# Patient Record
Sex: Female | Born: 1960 | Hispanic: No | Marital: Married | Smoking: Never smoker
Health system: Southern US, Community
[De-identification: ages and names within clinical notes are randomized; demographics above are authoritative.]

## PROBLEM LIST (undated history)

## (undated) DIAGNOSIS — I10 Essential (primary) hypertension: Secondary | ICD-10-CM

---

## 2017-12-27 ENCOUNTER — Other Ambulatory Visit: Payer: Self-pay

## 2017-12-27 ENCOUNTER — Emergency Department (HOSPITAL_BASED_OUTPATIENT_CLINIC_OR_DEPARTMENT_OTHER)
Admission: EM | Admit: 2017-12-27 | Discharge: 2017-12-27 | Disposition: A | Discharge status: Home / Self Care | Payer: Self-pay | Attending: Emergency Medicine | Admitting: Emergency Medicine

## 2017-12-27 ENCOUNTER — Emergency Department (HOSPITAL_BASED_OUTPATIENT_CLINIC_OR_DEPARTMENT_OTHER): Payer: Self-pay

## 2017-12-27 ENCOUNTER — Encounter (HOSPITAL_BASED_OUTPATIENT_CLINIC_OR_DEPARTMENT_OTHER): Payer: Self-pay | Admitting: *Deleted

## 2017-12-27 DIAGNOSIS — E860 Dehydration: Secondary | ICD-10-CM | POA: Insufficient documentation

## 2017-12-27 DIAGNOSIS — R509 Fever, unspecified: Secondary | ICD-10-CM | POA: Insufficient documentation

## 2017-12-27 LAB — URINALYSIS, MICROSCOPIC (REFLEX)

## 2017-12-27 LAB — CBC WITH DIFFERENTIAL/PLATELET
BASOS ABS: 0 10*3/uL (ref 0.0–0.1)
BASOS PCT: 0 %
Eosinophils Absolute: 0 10*3/uL (ref 0.0–0.7)
Eosinophils Relative: 0 %
HEMATOCRIT: 38.8 % (ref 36.0–46.0)
Hemoglobin: 13.1 g/dL (ref 12.0–15.0)
LYMPHS ABS: 0.4 10*3/uL — AB (ref 0.7–4.0)
LYMPHS PCT: 5 %
MCH: 30.5 pg (ref 26.0–34.0)
MCHC: 33.8 g/dL (ref 30.0–36.0)
MCV: 90.4 fL (ref 78.0–100.0)
MONO ABS: 0.4 10*3/uL (ref 0.1–1.0)
Monocytes Relative: 6 %
Neutro Abs: 6.8 10*3/uL (ref 1.7–7.7)
Neutrophils Relative %: 89 %
PLATELETS: 103 10*3/uL — AB (ref 150–400)
RBC: 4.29 MIL/uL (ref 3.87–5.11)
RDW: 14.3 % (ref 11.5–15.5)
WBC: 7.5 10*3/uL (ref 4.0–10.5)

## 2017-12-27 LAB — URINALYSIS, ROUTINE W REFLEX MICROSCOPIC
Bilirubin Urine: NEGATIVE
GLUCOSE, UA: NEGATIVE mg/dL
HGB URINE DIPSTICK: NEGATIVE
Ketones, ur: NEGATIVE mg/dL
Leukocytes, UA: NEGATIVE
Nitrite: NEGATIVE
PH: 6 (ref 5.0–8.0)
Protein, ur: 30 mg/dL — AB
Specific Gravity, Urine: 1.02 (ref 1.005–1.030)

## 2017-12-27 LAB — COMPREHENSIVE METABOLIC PANEL
ALBUMIN: 3.8 g/dL (ref 3.5–5.0)
ALT: 59 U/L — AB (ref 0–44)
AST: 64 U/L — AB (ref 15–41)
Alkaline Phosphatase: 82 U/L (ref 38–126)
Anion gap: 10 (ref 5–15)
BILIRUBIN TOTAL: 1.9 mg/dL — AB (ref 0.3–1.2)
BUN: 18 mg/dL (ref 6–20)
CHLORIDE: 94 mmol/L — AB (ref 98–111)
CO2: 24 mmol/L (ref 22–32)
CREATININE: 1.04 mg/dL — AB (ref 0.44–1.00)
Calcium: 8.7 mg/dL — ABNORMAL LOW (ref 8.9–10.3)
GFR calc Af Amer: >60 mL/min (ref >60)
GFR calc non Af Amer: 59 mL/min — ABNORMAL LOW (ref >60)
GLUCOSE: 171 mg/dL — AB (ref 70–99)
POTASSIUM: 3.7 mmol/L (ref 3.5–5.1)
Sodium: 128 mmol/L — ABNORMAL LOW (ref 135–145)
Total Protein: 8 g/dL (ref 6.5–8.1)

## 2017-12-27 LAB — LIPASE, BLOOD: Lipase: 25 U/L (ref 11–51)

## 2017-12-27 LAB — I-STAT CG4 LACTIC ACID, ED: Lactic Acid, Venous: 2.28 mmol/L (ref 0.5–1.9)

## 2017-12-27 MED ORDER — ACETAMINOPHEN 325 MG PO TABS
650.0000 mg | ORAL_TABLET | Freq: Once | ORAL | Status: AC
  Administered 2017-12-27: 650 mg via ORAL
  Filled 2017-12-27: qty 2

## 2017-12-27 MED ORDER — ONDANSETRON HCL 4 MG/2ML IJ SOLN
4.0000 mg | Freq: Once | INTRAMUSCULAR | Status: AC
  Administered 2017-12-27: 4 mg via INTRAVENOUS
  Filled 2017-12-27: qty 2

## 2017-12-27 MED ORDER — IBUPROFEN 400 MG PO TABS
400.0000 mg | ORAL_TABLET | Freq: Once | ORAL | Status: AC
  Administered 2017-12-27: 400 mg via ORAL
  Filled 2017-12-27: qty 1

## 2017-12-27 MED ORDER — SODIUM CHLORIDE 0.9 % IV SOLN: INTRAVENOUS | Status: DC

## 2017-12-27 MED ORDER — SODIUM CHLORIDE 0.9 % IV BOLUS
1000.0000 mL | Freq: Once | INTRAVENOUS | Status: AC
  Administered 2017-12-27: 1000 mL via INTRAVENOUS

## 2017-12-27 NOTE — ED Notes (Signed)
Patient is travelling from [place] and is staying with her nephew.  Nephew refused CT scan stating that he is worried because she does not have insurance.  He does not want CT scan done.

## 2017-12-27 NOTE — ED Provider Notes (Signed)
[facility] EMERGENCY DEPARTMENT Provider Note   CSN: [id number] Arrival date & time: 12/27/17  1817     History   Chief Complaint Chief Complaint  Patient presents with  . Fever  . Abdominal Pain    HPI Brandi Burnett is a 57 y.o. female.  Patient is a 57 year old female who is originally from [place] and has been visiting the [place] for the last 1 month.  She has a history of breast cancer that she did not receive chemotherapy for and a history of uterine fibroids as well as cysts on her liver and kidneys.  She states she had been feeling well since she has been in the states until Saturday 2 days prior to arrival when she started feeling feverish, nausea, decreased appetite and mild diffuse abdominal discomfort.  She denies any urinary symptoms, diarrhea, cough, shortness of breath.  She does not feel that her abdomen is distended or looks any different than normal.  Everyone she has been with has seemed healthy and they have all eaten the same kind of food and nobody else is feeling sick.  She takes medication at this time for hypertension but no other meds.  The history is provided by the patient and a relative. The history is limited by a language barrier. A language interpreter was used.  Fever   This is a new problem. The current episode started 2 days ago. The problem occurs constantly. The problem has not changed since onset.The maximum temperature noted was 101 to 101.9 F. The temperature was taken using an oral thermometer. Associated symptoms include muscle aches. Pertinent negatives include no chest pain, no diarrhea, no vomiting, no sore throat, no tugging at ear and no cough. Associated symptoms comments: Nausea and diffuse abd discomfort.  Anorexia and general weakness. Treatments tried: Paracetamol. The treatment provided mild relief.  Abdominal Pain   Associated symptoms include fever. Pertinent negatives include diarrhea and vomiting.    History reviewed.  No pertinent past medical history.  There are no active problems to display for this patient.   History reviewed. No pertinent surgical history.   OB History   None      Home Medications    Prior to Admission medications   Not on File    Family History No family history on file.  Social History Social History   Tobacco Use  . Smoking status: Never Smoker  . Smokeless tobacco: Never Used  Substance Use Topics  . Alcohol use: Never    Frequency: Never  . Drug use: Never     Allergies   Patient has no known allergies.   Review of Systems Review of Systems  Constitutional: Positive for fever.  HENT: Negative for sore throat.   Respiratory: Negative for cough.   Cardiovascular: Negative for chest pain.  Gastrointestinal: Positive for abdominal pain. Negative for diarrhea and vomiting.  All other systems reviewed and are negative.    Physical Exam Updated Vital Signs BP (!) 116/98   Pulse (!) 110   Temp 100.3 F (37.9 C) (Oral)   Resp 20   Ht 5\' 5"  (1.651 m)   Wt 59 kg   SpO2 99%   BMI 21.63 kg/m   Physical Exam  Constitutional: She is oriented to person, place, and time. She appears well-developed and well-nourished. No distress.  HENT:  Head: Normocephalic and atraumatic.  Mouth/Throat: Oropharynx is clear and moist.  Dry mucous membranes  Eyes: Pupils are equal, round, and reactive to light. Conjunctivae  and EOM are normal.  Neck: Normal range of motion. Neck supple.  Cardiovascular: Regular rhythm and intact distal pulses. Tachycardia present.  No murmur heard. Pulmonary/Chest: Effort normal and breath sounds normal. No respiratory distress. She has no wheezes. She has no rales.  Abdominal: Soft. Bowel sounds are normal. She exhibits distension. There is hepatomegaly. There is tenderness in the periumbilical area. There is no rebound and no guarding.  Musculoskeletal: Normal range of motion. She exhibits no edema or tenderness.  Neurological:  She is alert and oriented to person, place, and time.  Skin: Skin is warm and dry. No rash noted. No erythema.  Psychiatric: She has a normal mood and affect. Her behavior is normal.  Nursing note and vitals reviewed.    ED Treatments / Results  Labs (all labs ordered are listed, but only abnormal results are displayed) Labs Reviewed  CBC WITH DIFFERENTIAL/PLATELET - Abnormal; Notable for the following components:      Result Value   Lymphs Abs 0.4 (*)    All other components within normal limits  COMPREHENSIVE METABOLIC PANEL - Abnormal; Notable for the following components:   Sodium 128 (*)    Chloride 94 (*)    Glucose, Bld 171 (*)    Creatinine, Ser 1.04 (*)    Calcium 8.7 (*)    AST 64 (*)    ALT 59 (*)    Total Bilirubin 1.9 (*)    GFR calc non Af Amer 59 (*)    All other components within normal limits  URINALYSIS, ROUTINE W REFLEX MICROSCOPIC - Abnormal; Notable for the following components:   APPearance CLOUDY (*)    Protein, ur 30 (*)    All other components within normal limits  URINALYSIS, MICROSCOPIC (REFLEX) - Abnormal; Notable for the following components:   Bacteria, UA MANY (*)    All other components within normal limits  I-STAT CG4 LACTIC ACID, ED - Abnormal; Notable for the following components:   Lactic Acid, Venous 2.28 (*)    All other components within normal limits  LIPASE, BLOOD    EKG None  Radiology No results found.  Procedures Procedures (including critical care time)  Medications Ordered in ED Medications  sodium chloride 0.9 % bolus 1,000 mL (1,000 mLs Intravenous New Bag/Given 12/27/17 1959)  0.9 %  sodium chloride infusion (has no administration in time range)  ondansetron (ZOFRAN) injection 4 mg (4 mg Intravenous Given 12/27/17 1940)     Initial Impression / Assessment and Plan / ED Course  I have reviewed the triage vital signs and the nursing notes.  Pertinent labs & imaging results that were available during my care of the  patient were reviewed by me and considered in my medical decision making (see chart for details).     Patient presenting today with 2 days of fever, abdominal pain and nausea.  Unclear completely about patient's past medical history as she is from [place] and had all of her medical care there.  She does have a prior history of breast cancer and states she has been told she has cysts on her kidneys and liver.  On exam patient's abdomen is distended with hepatomegaly.  She was feeling well upon arrival here and low suspicion for foodborne illness or sick contact that she has had none.  Concern for some type of hepatitis or other liver etiology.  This could be viral.  Patient does appear dehydrated and is tachycardic.  Lactic acid is 2.28, CBC within normal limits but platelets  are still pending, CMP with mild hyponatremia of 128, creatinine of 1.04, elevated LFTs with AST of 64 ALT of 59 and total bilirubin of 1.9, lipase within normal limits.  UA without specific findings.  Patient given IV fluids and Zofran.  Recommended a CT for further evaluation but family is currently considering it as she does not have insurance here and they are concerned about the bill.   8:53 PM Patient is refusing CT and states now her abdomen does not hurt at all.  She is feeling better after fluids and able to tolerate p.o.'s.  At this point patient has a fever of unknown origin.  Could be viral in nature but also discussed with the family it could be something abdominal.  Also stressed with the family her abnormal liver function tests.  Patient would like to follow-up with her doctor when she returns to [place] next week.  Gave family strict return precautions.  Repeat temp here was 103 and pt given ibuprofen.  Tolerating po's.  After temp is still 103.  She was given dose of tylenol and pt d/ced homel Final Clinical Impressions(s) / ED Diagnoses   Final diagnoses:  Fever, unspecified fever cause  Dehydration    ED  Discharge Orders    None       Gwyneth Sprout, MD 12/27/17 2227

## 2017-12-27 NOTE — ED Triage Notes (Signed)
Pt is visiting from [place]. She has been here for almost a month. Here tonight with fever. Chills. Abdominal pain and nausea.

## 2017-12-27 NOTE — Progress Notes (Signed)
DR Anitra Lauth notified of lactic acid result

## 2017-12-27 NOTE — Discharge Instructions (Addendum)
Use Ibuprofen every 6 hours for the fever.  You can take lukewarm baths and do not wrap up in blankets

## 2017-12-29 ENCOUNTER — Emergency Department (HOSPITAL_BASED_OUTPATIENT_CLINIC_OR_DEPARTMENT_OTHER): Payer: Self-pay

## 2017-12-29 ENCOUNTER — Encounter (HOSPITAL_BASED_OUTPATIENT_CLINIC_OR_DEPARTMENT_OTHER): Payer: Self-pay | Admitting: *Deleted

## 2017-12-29 ENCOUNTER — Emergency Department (HOSPITAL_BASED_OUTPATIENT_CLINIC_OR_DEPARTMENT_OTHER)
Admission: EM | Admit: 2017-12-29 | Discharge: 2017-12-30 | Disposition: A | Discharge status: Home / Self Care | Payer: Self-pay | Attending: Emergency Medicine | Admitting: Emergency Medicine

## 2017-12-29 ENCOUNTER — Other Ambulatory Visit: Payer: Self-pay

## 2017-12-29 DIAGNOSIS — Q446 Cystic disease of liver: Secondary | ICD-10-CM | POA: Insufficient documentation

## 2017-12-29 DIAGNOSIS — Q613 Polycystic kidney, unspecified: Secondary | ICD-10-CM | POA: Insufficient documentation

## 2017-12-29 DIAGNOSIS — I10 Essential (primary) hypertension: Secondary | ICD-10-CM | POA: Insufficient documentation

## 2017-12-29 DIAGNOSIS — Z79899 Other long term (current) drug therapy: Secondary | ICD-10-CM | POA: Insufficient documentation

## 2017-12-29 HISTORY — DX: Essential (primary) hypertension: I10

## 2017-12-29 NOTE — ED Notes (Signed)
Husband reports ongoing fevers since Friday, increased fatigue and bloating to abdomen.

## 2017-12-29 NOTE — ED Provider Notes (Signed)
[facility] Provider Note: Lowella Dell, MD, FACEP  CSN: [id number] MRN: [id number] ARRIVAL: 12/29/17 at 2302 ROOM: [facility]03/[facility]   CHIEF COMPLAINT  Shortness of Breath  [place]rin interpreter used. HISTORY OF PRESENT ILLNESS  12/29/17 11:57 PM Brandi Burnett is a 57 y.o. female with a history of breast cancer not treated with chemotherapy.  She also has a history of uterine fibroids and cysts on her liver and kidneys.  She was seen here 2 days ago for fever, nausea, decreased appetite and mild diffuse abdominal discomfort.  At that time she denied urinary symptoms, diarrhea, cough and shortness of breath.  She was noted to have a distended abdomen with hepatomegaly at that time.  She was treated with IV fluids and Zofran.  She declined a recommended CT scan.  She returns with worsening abdominal distention now causing her to be short of breath.  She denies abdominal pain but states she has an abdominal discomfort.  In fact however she has pain when her liver is pressed on.  She denies nausea or vomiting but has had watery, green diarrhea for the past 2 days.  She has also had decreased appetite and a limited ability to swallow food or liquids.  She states her fevers have been controlled.  Her relative explains that she has a history of polycystic liver and kidney disease, not just simple cysts.   Past Medical History:  Diagnosis Date  . Hypertension     History reviewed. No pertinent surgical history.  History reviewed. No pertinent family history.  Social History   Tobacco Use  . Smoking status: Never Smoker  . Smokeless tobacco: Never Used  Substance Use Topics  . Alcohol use: Never    Frequency: Never  . Drug use: Never    Prior to Admission medications   Medication Sig Start Date End Date Taking? Authorizing Provider  acetaminophen (TYLENOL) 500 MG tablet Take 500 mg by mouth every 6 (six) hours as needed.   Yes [provider]  ibuprofen (ADVIL,MOTRIN) 200  MG tablet Take 200 mg by mouth every 6 (six) hours as needed.   Yes [provider]    Allergies Patient has no known allergies.   REVIEW OF SYSTEMS  Negative except as noted here or in the History of Present Illness.   PHYSICAL EXAMINATION  Initial Vital Signs Pulse (!) 105, temperature 98.5 F (36.9 C), temperature source Oral, resp. rate 16, height 5\' 5"  (1.651 m), weight 58 kg, SpO2 100 %.  Examination General: Well-developed, well-nourished female in no acute distress; appearance consistent with age of record HENT: normocephalic; atraumatic Eyes: pupils equal, round and reactive to light; extraocular muscles intact Neck: supple Heart: regular rate and rhythm Lungs: clear to auscultation bilaterally; decreased sounds in bases Abdomen: Soft; distended; tender, enlarged liver; bowel sounds present but hypoactive Extremities: No deformity; full range of motion; pulses normal Neurologic: Awake, alert; motor function intact in all extremities and symmetric; no facial droop Skin: Warm and dry; pale Psychiatric: Normal mood and affect   RESULTS  Summary of this visit's results, reviewed by myself:   EKG Interpretation  Date/Time:    Ventricular Rate:    PR Interval:    QRS Duration:   QT Interval:    QTC Calculation:   R Axis:     Text Interpretation:        Laboratory Studies: Results for orders placed or performed during the hospital encounter of 12/29/17 (from the past 24 hour(s))  Comprehensive metabolic panel  Status: Abnormal   Collection Time: 12/30/17 12:28 AM  Result Value Ref Range   Sodium 130 (L) 135 - 145 mmol/L   Potassium 3.4 (L) 3.5 - 5.1 mmol/L   Chloride 99 98 - 111 mmol/L   CO2 20 (L) 22 - 32 mmol/L   Glucose, Bld 113 (H) 70 - 99 mg/dL   BUN 32 (H) 6 - 20 mg/dL   Creatinine, Ser 0.98 (H) 0.44 - 1.00 mg/dL   Calcium 7.4 (L) 8.9 - 10.3 mg/dL   Total Protein 6.0 (L) 6.5 - 8.1 g/dL   Albumin 2.7 (L) 3.5 - 5.0 g/dL   AST 28 15 - 41  U/L   ALT 36 0 - 44 U/L   Alkaline Phosphatase 104 38 - 126 U/L   Total Bilirubin 1.4 (H) 0.3 - 1.2 mg/dL   GFR calc non Af Amer 48 (L) >60 mL/min   GFR calc Af Amer 55 (L) >60 mL/min   Anion gap 11 5 - 15  Lipase, blood     Status: None   Collection Time: 12/30/17 12:28 AM  Result Value Ref Range   Lipase 20 11 - 51 U/L  CBC with Differential/Platelet     Status: Abnormal   Collection Time: 12/30/17 12:28 AM  Result Value Ref Range   WBC 6.2 4.0 - 10.5 K/uL   RBC 3.84 (L) 3.87 - 5.11 MIL/uL   Hemoglobin 11.6 (L) 12.0 - 15.0 g/dL   HCT 11.9 (L) 14.7 - 82.9 %   MCV 92.7 80.0 - 100.0 fL   MCH 30.2 26.0 - 34.0 pg   MCHC 32.6 30.0 - 36.0 g/dL   RDW 56.2 13.0 - 86.5 %   Platelets 111 (L) 150 - 400 K/uL   nRBC 0.0 0.0 - 0.2 %   Neutrophils Relative % 82 %   Neutro Abs 5.1 1.7 - 7.7 K/uL   Lymphocytes Relative 6 %   Lymphs Abs 0.4 (L) 0.7 - 4.0 K/uL   Monocytes Relative 8 %   Monocytes Absolute 0.5 0.1 - 1.0 K/uL   Eosinophils Relative 3 %   Eosinophils Absolute 0.2 0.0 - 0.5 K/uL   Basophils Relative 0 %   Basophils Absolute 0.0 0.0 - 0.1 K/uL   WBC Morphology VACUOLATED NEUTROPHILS    Smear Review PLATELETS APPEAR DECREASED    Immature Granulocytes 1 %   Abs Immature Granulocytes 0.05 0.00 - 0.07 K/uL   Platelet Morphology PAD    Imaging Studies: Dg Chest 2 View  Result Date: 12/30/2017 CLINICAL DATA:  Dyspnea EXAM: CHEST - 2 VIEW COMPARISON:  None. FINDINGS: Low lung volumes. No pulmonary consolidation or effusion. No overt pulmonary edema. Slight reversal of thoracic curvature possibly positional. Heart size is normal. Nonaneurysmal thoracic aorta. Five metallic circular densities project over the left lateral chest. IMPRESSION: No active cardiopulmonary disease. Electronically Signed   By: Tollie Eth M.D.   On: 12/30/2017 00:05   Ct Abdomen Pelvis W Contrast  Result Date: 12/30/2017 CLINICAL DATA:  Abdominal pain and bloating. Polycystic liver and kidney disease. EXAM:  CT ABDOMEN AND PELVIS WITH CONTRAST TECHNIQUE: Multidetector CT imaging of the abdomen and pelvis was performed using the standard protocol following bolus administration of intravenous contrast. CONTRAST:  ISOVUE-300 IOPAMIDOL (ISOVUE-300) INJECTION 61% COMPARISON:  None. FINDINGS: Lower chest: Small bilateral pleural effusions. Mild atelectasis along both hemidiaphragms. Bilateral breast implants. Hepatobiliary: Notable hepatomegaly with innumerable cysts throughout the hepatic parenchyma. The liver measures about 29.6 cm craniocaudad. There is an appearance of  some peripheral vascular shunting or portosystemic shunting in the periphery of the right hepatic lobe for example on images 62-64 series 2 or there is a notable subcapsular vascular channel. There is also some heterogeneity in density in the hepatic parenchyma on the early phase images which becomes more uniform on delayed images. Lobularity of the liver may reflect underlying cirrhosis or could be a consequence of the extensive cystic disease. There are some punctate calcifications along confluent septations between cysts posteriorly in the right hepatic lobe for example on image 72/5. Portal vein is patent. The multiplicity of cysts makes it difficult to recognize the gallbladder or biliary tree, but no definite intrahepatic biliary dilatation is identified. Pancreas: Unremarkable Spleen: There is mass effect on the margins of the spleen due to the hepatic cysts. Adrenals/Urinary Tract: Polycystic kidney disease is present bilaterally with the left kidney measuring 17.3 cm in length and the right kidney measuring 14.1 cm in length. There is likely scarring in the right kidney. Extensive cystic disease in both kidneys of varying complexity but mostly simple noted. There some scant calcifications along septations, for example in the right kidney on image 71/2. The parapelvic cysts cause distortion of the renal collecting systems. No definite urinary  tract calculi. No hydronephrosis. Stomach/Bowel: There is mass effect on the stomach from some of the hepatic cystic lesions. No appreciable dilated bowel. The appendix is poorly seen. Vascular/Lymphatic: Prominent venous structures to the left of the abdominal aorta at the level of the renal veins. A suspected lymph node anterior to the abdominal aorta measures 1.2 cm in short axis on image 55/2. This could instead be a venous varix. Reproductive: Mildly prominent uterus for age, measuring 10.3 by 6.5 by 7.8 cm. Other: Scattered ascites, cause uncertain. This is more confluent in the pelvis. Musculoskeletal: Slight kyphosis at the L2-3 level with mild disc bulge at this level. Small umbilical hernia contains adipose tissue. IMPRESSION: 1. Polycystic liver and polycystic kidney disease with markedly expanded liver and kidneys due to innumerable cysts. Nodular contour the liver could reflect cirrhosis. There is likely some portosystemic shunting along the liver capsule in the right hepatic lobe. 2. Scattered mild to moderate ascites. 3. Small bilateral pleural effusions. 4. Questionable mildly enlarged retroperitoneal lymph node, versus venous varix. 5. Mildly prominent uterus for age. Electronically Signed   By: Gaylyn Rong M.D.   On: 12/30/2017 02:36    ED COURSE and MDM  Nursing notes and initial vitals signs, including pulse oximetry, reviewed.  Vitals:   12/29/17 2312 12/29/17 2313 12/30/17 0220  BP:   (!) 90/52  Pulse: (!) 105  82  Resp: 16  16  Temp: 98.5 F (36.9 C)  97.7 F (36.5 C)  TempSrc: Oral  Oral  SpO2: 100%  98%  Weight:  58 kg   Height:  5\' 5"  (1.651 m)    3:06 AM The patient was advised that admission was recommended so that interventional radiology could be consulted for possible aspiration of fluid for symptomatic relief.  The patient chose to decline admission and prefers to follow-up with her primary care physician in [place] which she plans to travel in a few days.   She was advised she should return should she changed her mind or should symptoms worsen.  PROCEDURES    ED DIAGNOSES     ICD-10-CM   1. Polycystic liver with compressive symptoms Q44.6   2. Polycystic kidney Q61.3        Frederica Chrestman, Jonny Ruiz, MD 12/30/17 [phone]

## 2017-12-29 NOTE — ED Triage Notes (Signed)
Pt c/o SOb x 1 hr , seen here 3 days ago for  fever and dehydration

## 2017-12-30 ENCOUNTER — Emergency Department (HOSPITAL_BASED_OUTPATIENT_CLINIC_OR_DEPARTMENT_OTHER): Payer: Self-pay

## 2017-12-30 LAB — CBC WITH DIFFERENTIAL/PLATELET
ABS IMMATURE GRANULOCYTES: 0.05 10*3/uL (ref 0.00–0.07)
Basophils Absolute: 0 10*3/uL (ref 0.0–0.1)
Basophils Relative: 0 %
EOS PCT: 3 %
Eosinophils Absolute: 0.2 10*3/uL (ref 0.0–0.5)
HEMATOCRIT: 35.6 % — AB (ref 36.0–46.0)
Hemoglobin: 11.6 g/dL — ABNORMAL LOW (ref 12.0–15.0)
IMMATURE GRANULOCYTES: 1 %
LYMPHS ABS: 0.4 10*3/uL — AB (ref 0.7–4.0)
LYMPHS PCT: 6 %
MCH: 30.2 pg (ref 26.0–34.0)
MCHC: 32.6 g/dL (ref 30.0–36.0)
MCV: 92.7 fL (ref 80.0–100.0)
MONOS PCT: 8 %
Monocytes Absolute: 0.5 10*3/uL (ref 0.1–1.0)
NRBC: 0 % (ref 0.0–0.2)
Neutro Abs: 5.1 10*3/uL (ref 1.7–7.7)
Neutrophils Relative %: 82 %
PLATELETS: 111 10*3/uL — AB (ref 150–400)
RBC: 3.84 MIL/uL — ABNORMAL LOW (ref 3.87–5.11)
RDW: 14.5 % (ref 11.5–15.5)
Smear Review: DECREASED
WBC: 6.2 10*3/uL (ref 4.0–10.5)

## 2017-12-30 LAB — COMPREHENSIVE METABOLIC PANEL
ALT: 36 U/L (ref 0–44)
ANION GAP: 11 (ref 5–15)
AST: 28 U/L (ref 15–41)
Albumin: 2.7 g/dL — ABNORMAL LOW (ref 3.5–5.0)
Alkaline Phosphatase: 104 U/L (ref 38–126)
BILIRUBIN TOTAL: 1.4 mg/dL — AB (ref 0.3–1.2)
BUN: 32 mg/dL — AB (ref 6–20)
CALCIUM: 7.4 mg/dL — AB (ref 8.9–10.3)
CO2: 20 mmol/L — AB (ref 22–32)
CREATININE: 1.25 mg/dL — AB (ref 0.44–1.00)
Chloride: 99 mmol/L (ref 98–111)
GFR calc Af Amer: 55 mL/min — ABNORMAL LOW (ref >60)
GFR calc non Af Amer: 48 mL/min — ABNORMAL LOW (ref >60)
GLUCOSE: 113 mg/dL — AB (ref 70–99)
Potassium: 3.4 mmol/L — ABNORMAL LOW (ref 3.5–5.1)
SODIUM: 130 mmol/L — AB (ref 135–145)
TOTAL PROTEIN: 6 g/dL — AB (ref 6.5–8.1)

## 2017-12-30 LAB — LIPASE, BLOOD: Lipase: 20 U/L (ref 11–51)

## 2017-12-30 MED ORDER — THIAMINE HCL 100 MG/ML IJ SOLN
INTRAMUSCULAR | Status: AC
  Filled 2017-12-30: qty 2

## 2017-12-30 MED ORDER — THIAMINE HCL 100 MG/ML IJ SOLN
100.0000 mg | Freq: Once | INTRAMUSCULAR | Status: AC
  Administered 2017-12-30: 100 mg via INTRAVENOUS

## 2017-12-30 MED ORDER — IOPAMIDOL (ISOVUE-300) INJECTION 61%
100.0000 mL | Freq: Once | INTRAVENOUS | Status: AC | PRN
  Administered 2017-12-30: 100 mL via INTRAVENOUS

## 2017-12-30 MED ORDER — SODIUM CHLORIDE 0.9 % IV BOLUS
1000.0000 mL | Freq: Once | INTRAVENOUS | Status: AC
  Administered 2017-12-30: 1000 mL via INTRAVENOUS

## 2017-12-30 NOTE — Discharge Instructions (Signed)
Return if symptoms are worsening or you change your mind about being admitted.

## 2017-12-30 NOTE — ED Notes (Signed)
Attempted to restart IV x 2 without success 

## 2017-12-30 NOTE — ED Notes (Signed)
ED Provider at bedside. 

## 2017-12-30 NOTE — ED Notes (Signed)
Pt returned from CT °

## 2017-12-31 LAB — HEPATITIS PANEL, ACUTE
HCV Ab: 0.2 s/co ratio (ref 0.0–0.9)
HEP B C IGM: NEGATIVE
Hep A IgM: NEGATIVE
Hepatitis B Surface Ag: NEGATIVE

## 2019-07-03 IMAGING — DX DG CHEST 2V
2 series · 2 of 2 positions shown · non-contrast
Comparison: None.

CLINICAL DATA: Dyspnea

EXAM:
CHEST - 2 VIEW

[chest pa]
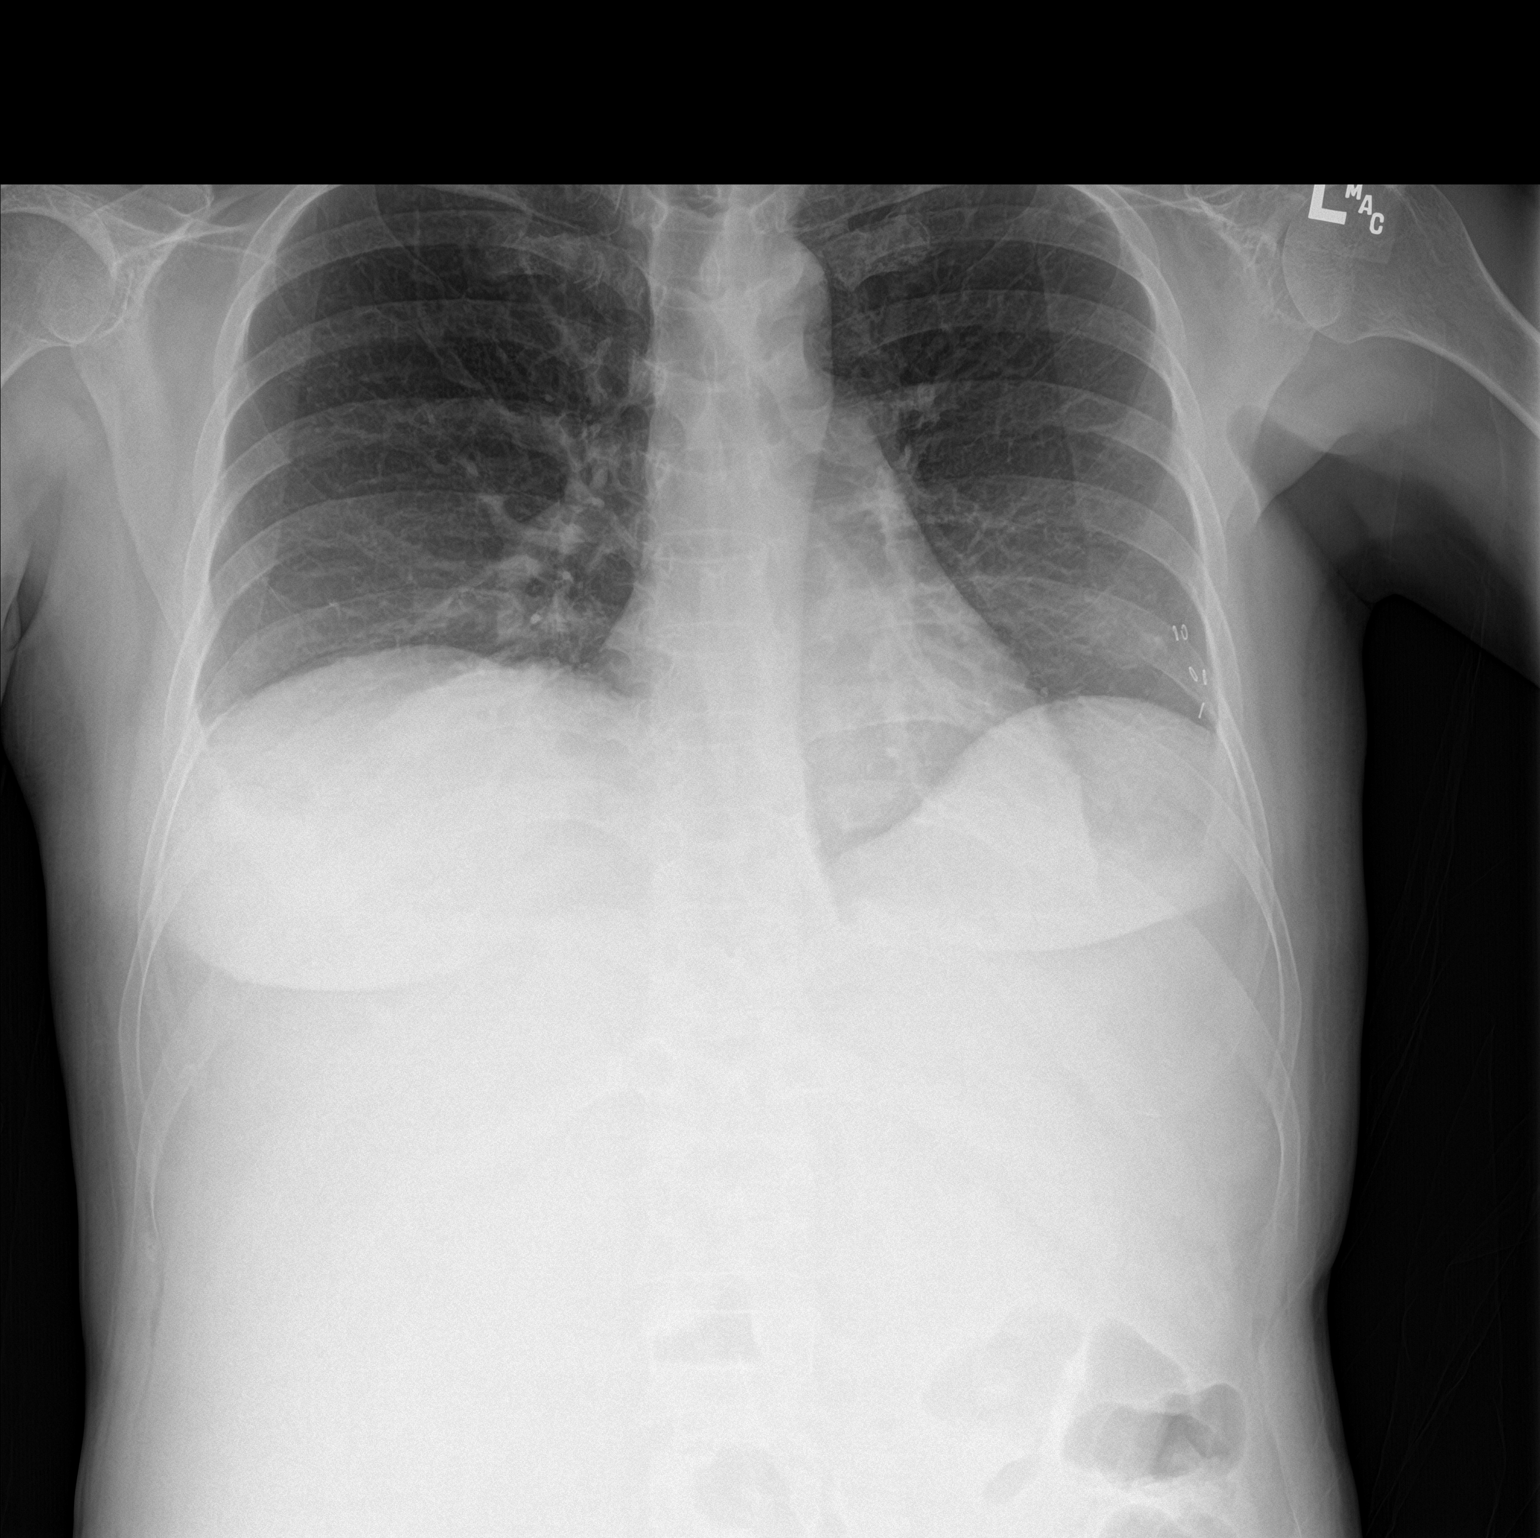

[chest lat]
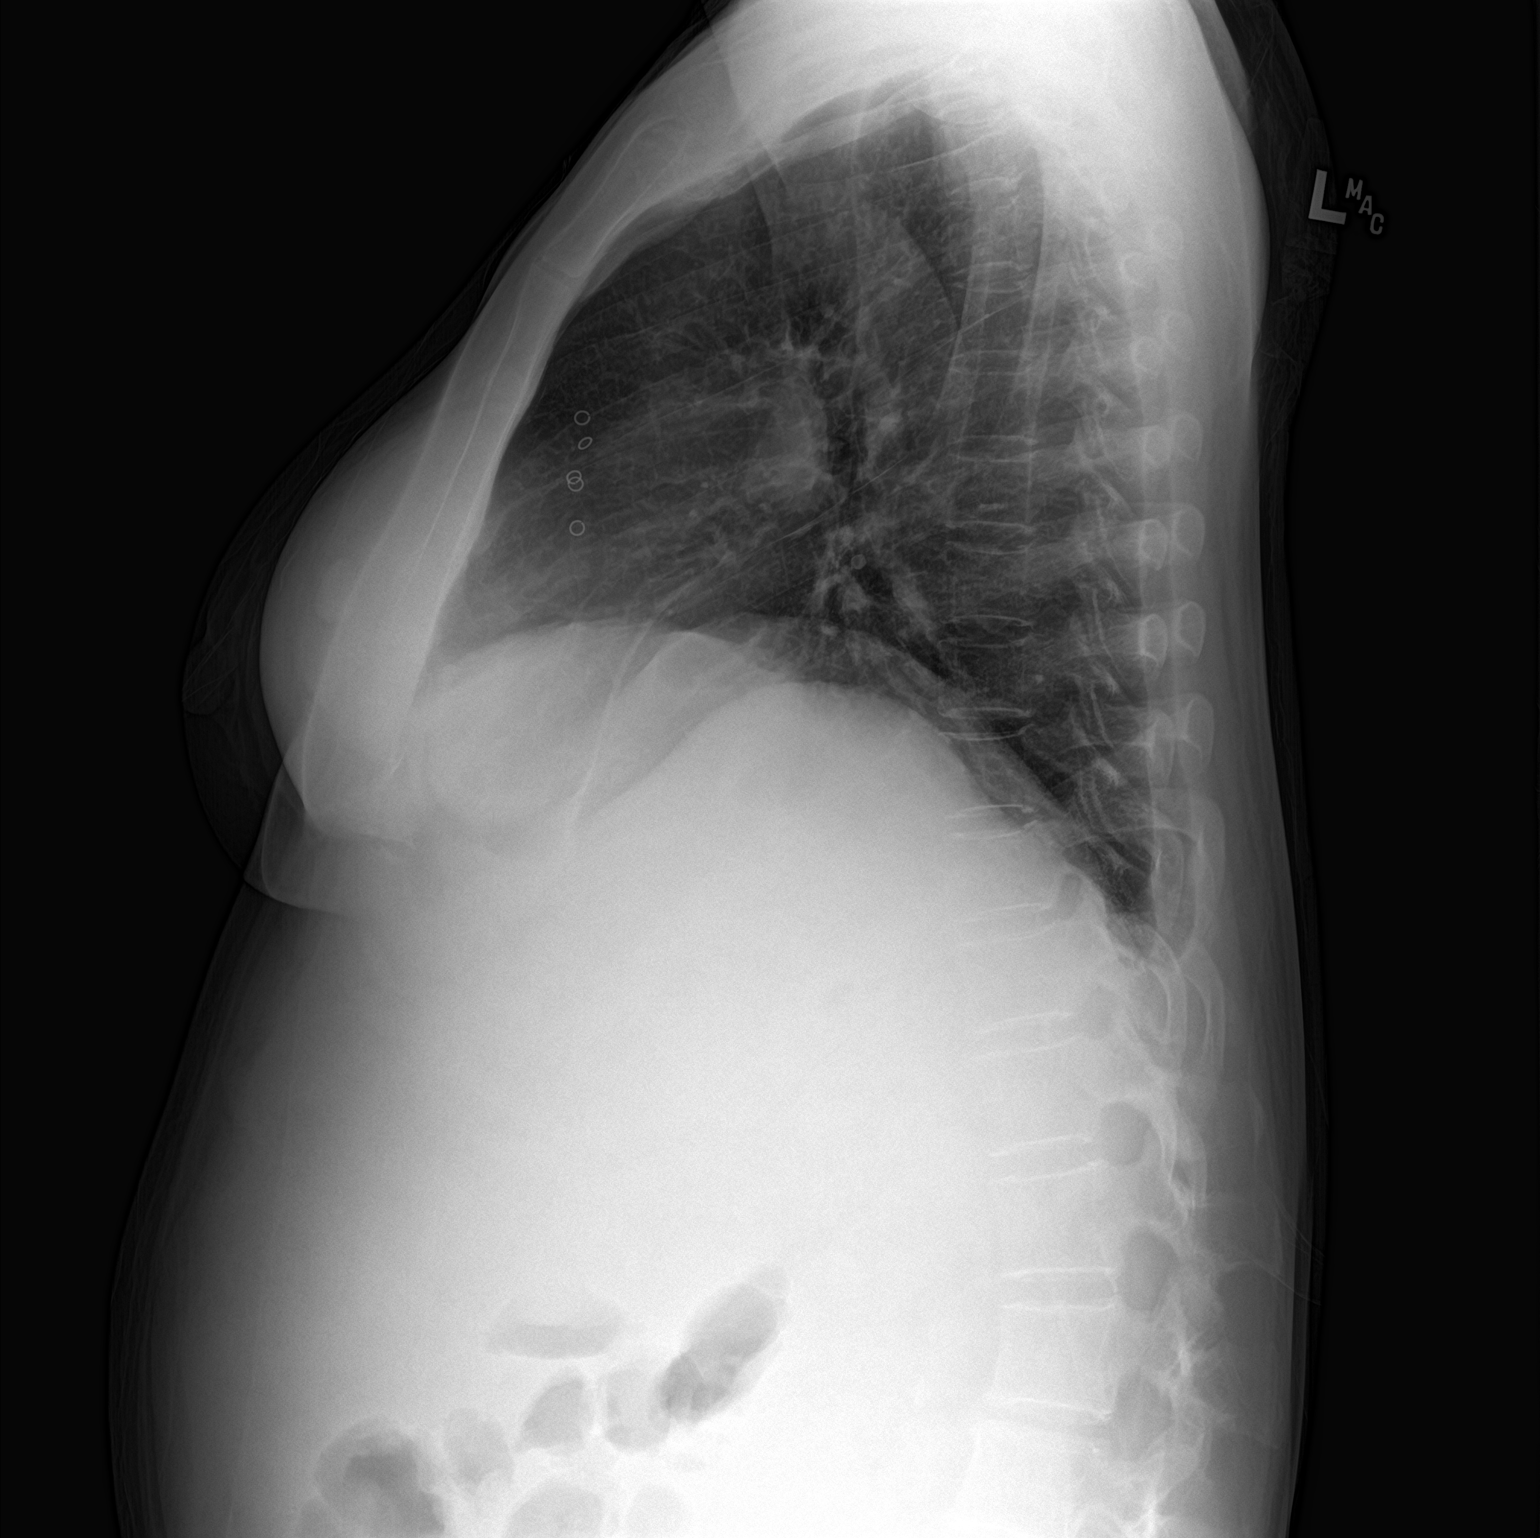

[2 of 2 positions shown; findings below may reference images not displayed]

FINDINGS: Low lung volumes. No pulmonary consolidation or effusion. No overt
pulmonary edema. Slight reversal of thoracic curvature possibly
positional. Heart size is normal. Nonaneurysmal thoracic aorta. Five
metallic circular densities project over the left lateral chest.
IMPRESSION: No active cardiopulmonary disease.

## 2019-07-04 IMAGING — CT CT ABD-PELV W/ CM
2 of 5 series · 15 of 46 positions shown, 17 images · IV contrast (APPLIED)
Comparison: None.

CLINICAL DATA: Abdominal pain and bloating. Polycystic liver and
kidney disease.

EXAM:
CT ABDOMEN AND PELVIS WITH CONTRAST
TECHNIQUE: Multidetector CT imaging of the abdomen and pelvis was performed
using the standard protocol following bolus administration of
intravenous contrast.
CONTRAST:  100mL 7LVD7A-U55 IOPAMIDOL ([id number]) INJECTION 61%

[Series 2: axial st · axial · 0.81mm/px · z∈[-522,-12]mm · 12 of 116 slices shown, 14 images]
[im 7/116  soft-tissue]
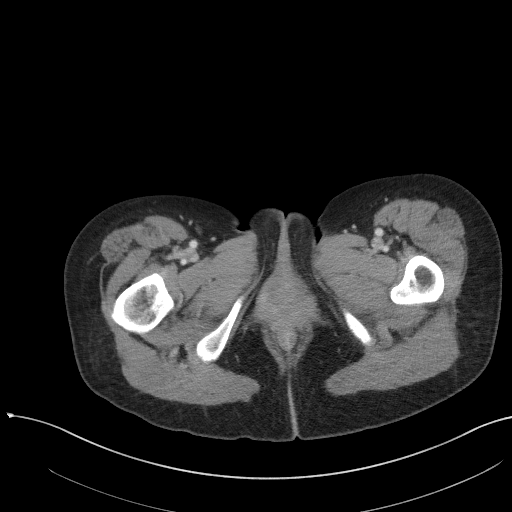
[im 7/116  bone]
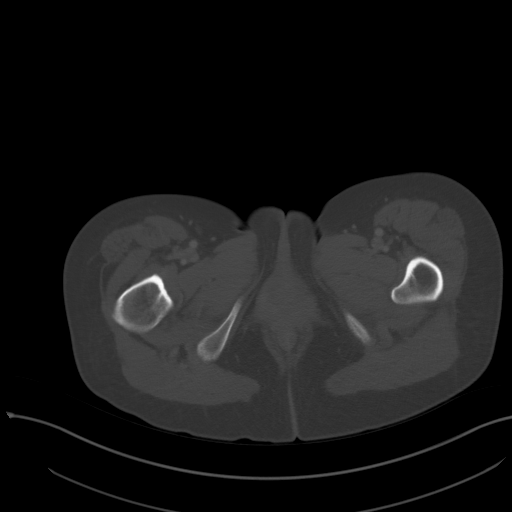
[im 20/116  soft-tissue]
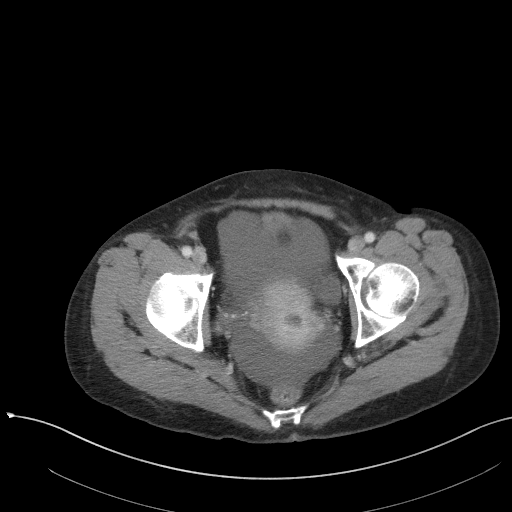
[im 26/116  soft-tissue]
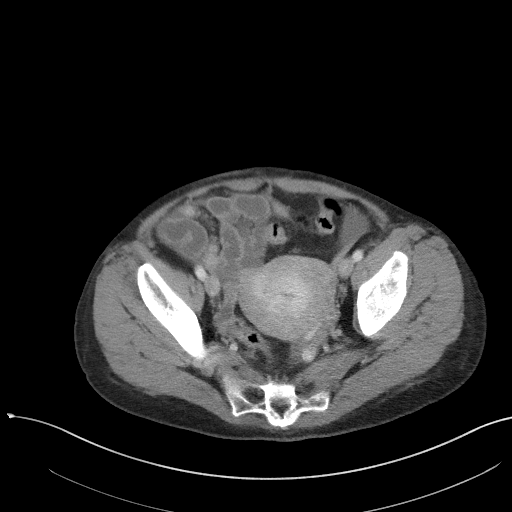
[im 32/116  soft-tissue]
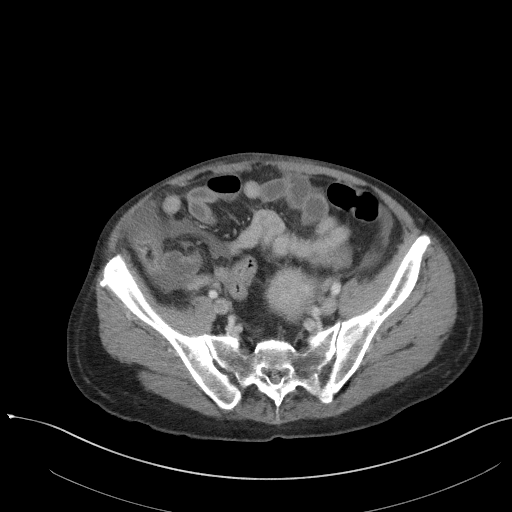
[im 45/116  soft-tissue]
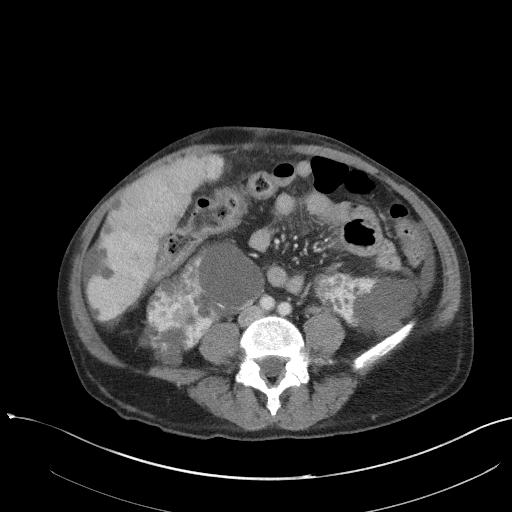
[im 52/116  soft-tissue]
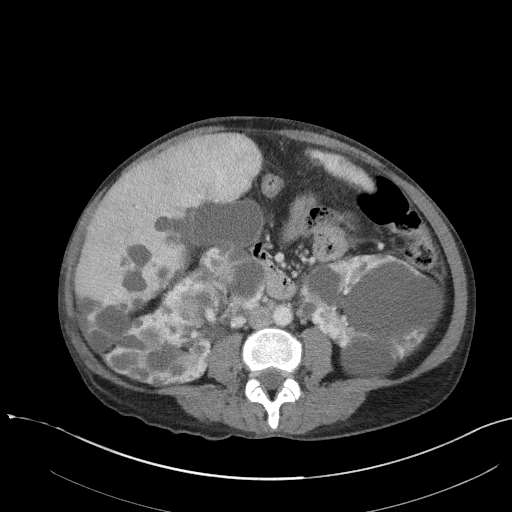
[im 64/116  soft-tissue]
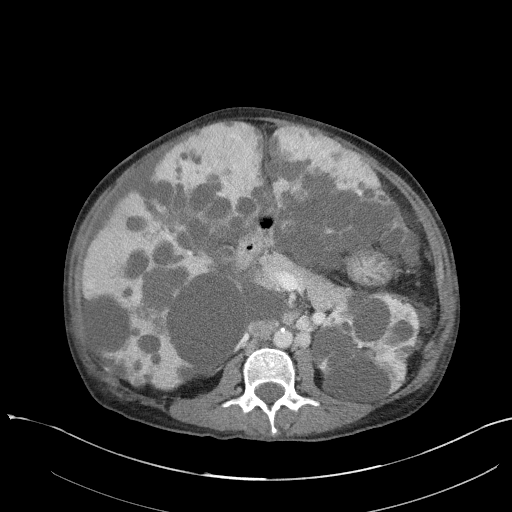
[im 71/116  soft-tissue]
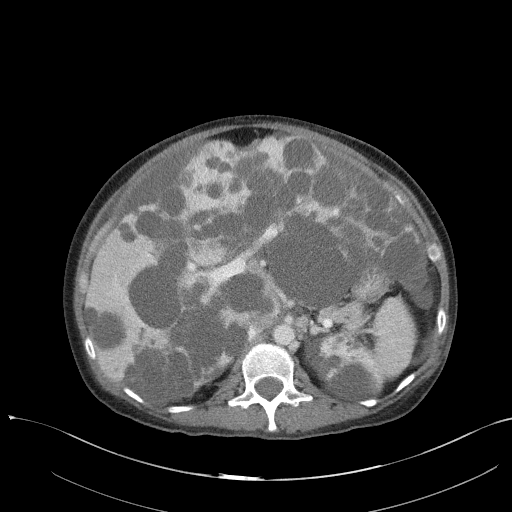
[im 84/116  soft-tissue]
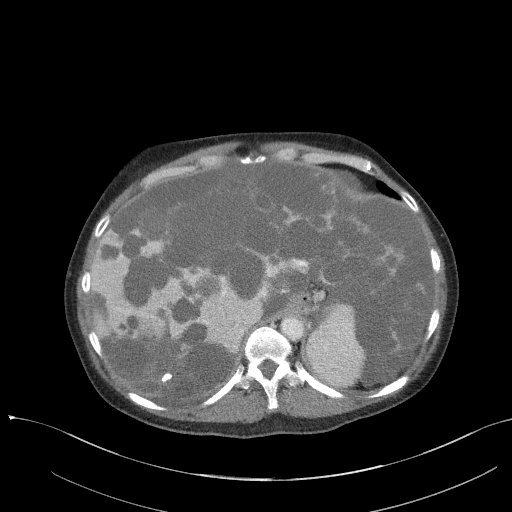
[im 84/116  bone]
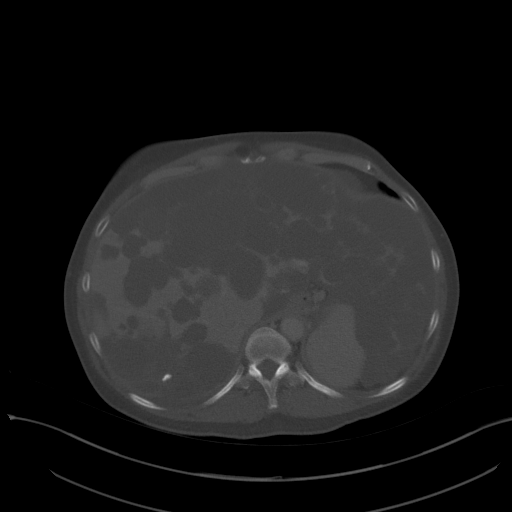
[im 90/116  soft-tissue]
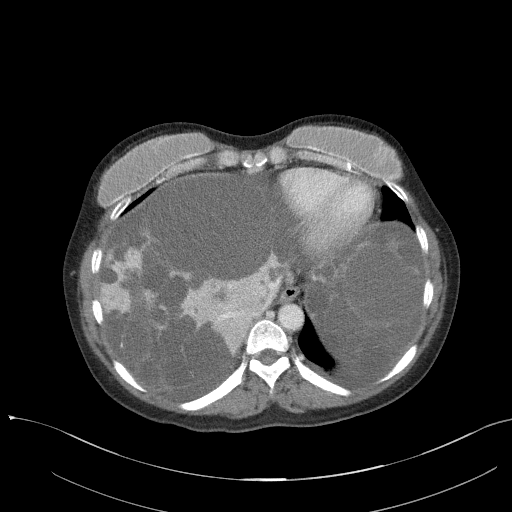
[im 96/116  soft-tissue]
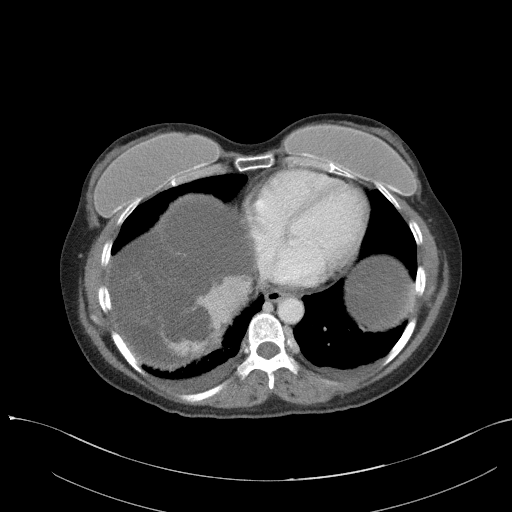
[im 109/116  soft-tissue]
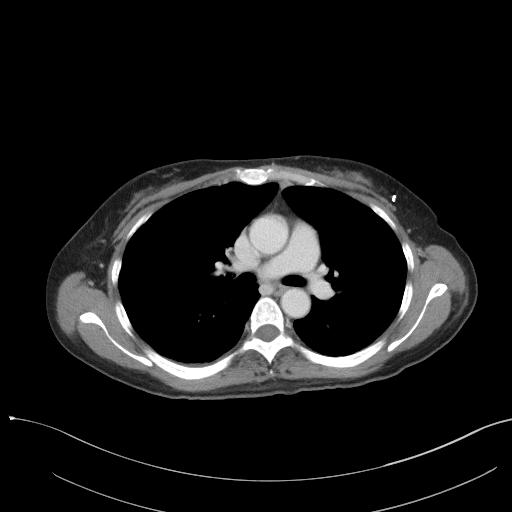

[Series 5: coronal st · coronal · 0.85mm/px · 3 of 88 slices shown]
[im 30/88  soft-tissue]
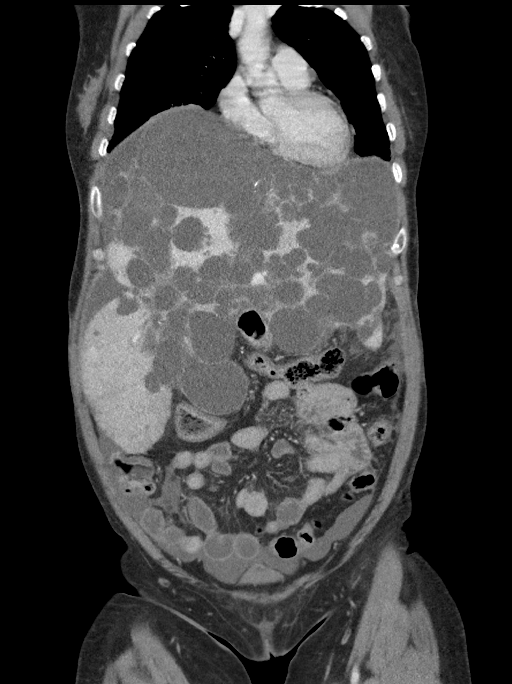
[im 39/88  soft-tissue]
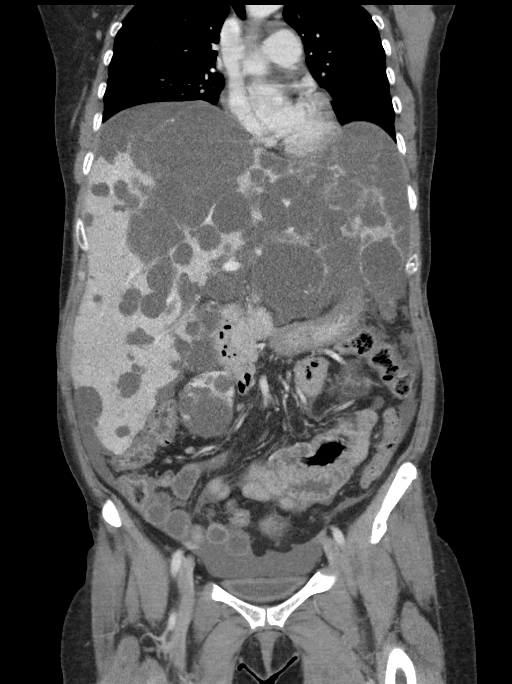
[im 49/88  soft-tissue]
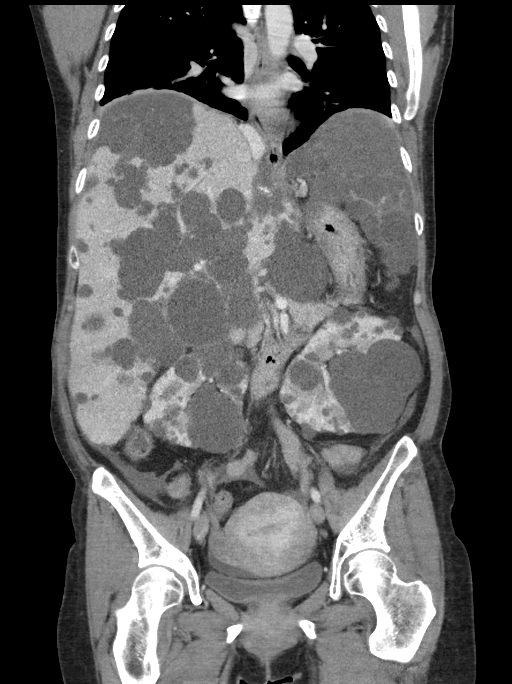

[15 of 46 positions shown; findings below may reference images not displayed]

FINDINGS: Lower chest: Small bilateral pleural effusions. Mild atelectasis
along both hemidiaphragms. Bilateral breast implants.

Hepatobiliary: Notable hepatomegaly with innumerable cysts
throughout the hepatic parenchyma. The liver measures about 29.6 cm
craniocaudad. There is an appearance of some peripheral vascular
shunting or portosystemic shunting in the periphery of the right
notable subcapsular vascular channel. There is also some
heterogeneity in density in the hepatic parenchyma on the early
phase images which becomes more uniform on delayed images.
Lobularity of the liver may reflect underlying cirrhosis or could be
a consequence of the extensive cystic disease. There are some
punctate calcifications along confluent septations between cysts
posteriorly in the right hepatic lobe for example on image 72/5.

Portal vein is patent. The multiplicity of cysts makes it difficult
to recognize the gallbladder or biliary tree, but no definite
intrahepatic biliary dilatation is identified.

Pancreas: Unremarkable

Spleen: There is mass effect on the margins of the spleen due to the
hepatic cysts.

Adrenals/Urinary Tract: Polycystic kidney disease is present
bilaterally with the left kidney measuring 17.3 cm in length and the
right kidney measuring 14.1 cm in length. There is likely scarring
in the right kidney. Extensive cystic disease in both kidneys of
varying complexity but mostly simple noted. There some scant
calcifications along septations, for example in the right kidney on
image 71/2. The parapelvic cysts cause distortion of the renal
collecting systems. No definite urinary tract calculi. No
hydronephrosis.

Stomach/Bowel: There is mass effect on the stomach from some of the
hepatic cystic lesions. No appreciable dilated bowel. The appendix
is poorly seen.

Vascular/Lymphatic: Prominent venous structures to the left of the
abdominal aorta at the level of the renal veins. A suspected lymph
node anterior to the abdominal aorta measures 1.2 cm in short axis
on image 55/2. This could instead be a venous varix.

Reproductive: Mildly prominent uterus for age, measuring 10.3 by
by 7.8 cm.

Other: Scattered ascites, cause uncertain. This is more confluent in
the pelvis.

Musculoskeletal: Slight kyphosis at the L2-3 level with mild disc
bulge at this level. Small umbilical hernia contains adipose tissue.
IMPRESSION: 1. Polycystic liver and polycystic kidney disease with markedly
expanded liver and kidneys due to innumerable cysts. Nodular contour
the liver could reflect cirrhosis. There is likely some
portosystemic shunting along the liver capsule in the right hepatic
lobe.
2. Scattered mild to moderate ascites.
3. Small bilateral pleural effusions.
4. Questionable mildly enlarged retroperitoneal lymph node, versus
venous varix.
5. Mildly prominent uterus for age.
